# Patient Record
Sex: Male | Born: 1985 | Race: White | Hispanic: No | Marital: Single | Smoking: Current every day smoker
Health system: Southern US, Community
[De-identification: ages and names within clinical notes are randomized; demographics above are authoritative.]

## PROBLEM LIST (undated history)

## (undated) DIAGNOSIS — T7840XA Allergy, unspecified, initial encounter: Secondary | ICD-10-CM

## (undated) HISTORY — PX: FRACTURE SURGERY: SHX138

## (undated) HISTORY — DX: Allergy, unspecified, initial encounter: T78.40XA

---

## 2017-07-21 ENCOUNTER — Ambulatory Visit: Payer: BLUE CROSS/BLUE SHIELD | Admitting: Psychology

## 2017-08-10 ENCOUNTER — Ambulatory Visit (INDEPENDENT_AMBULATORY_CARE_PROVIDER_SITE_OTHER): Payer: BLUE CROSS/BLUE SHIELD | Admitting: Clinical

## 2017-08-10 DIAGNOSIS — F4323 Adjustment disorder with mixed anxiety and depressed mood: Secondary | ICD-10-CM

## 2017-08-19 ENCOUNTER — Ambulatory Visit (INDEPENDENT_AMBULATORY_CARE_PROVIDER_SITE_OTHER): Payer: BLUE CROSS/BLUE SHIELD | Admitting: Clinical

## 2017-08-19 DIAGNOSIS — F4323 Adjustment disorder with mixed anxiety and depressed mood: Secondary | ICD-10-CM

## 2017-08-20 ENCOUNTER — Ambulatory Visit (INDEPENDENT_AMBULATORY_CARE_PROVIDER_SITE_OTHER): Payer: BLUE CROSS/BLUE SHIELD | Admitting: Clinical

## 2017-08-20 DIAGNOSIS — F4323 Adjustment disorder with mixed anxiety and depressed mood: Secondary | ICD-10-CM | POA: Diagnosis not present

## 2017-08-27 ENCOUNTER — Ambulatory Visit: Payer: BLUE CROSS/BLUE SHIELD | Admitting: Clinical

## 2018-05-07 ENCOUNTER — Ambulatory Visit (INDEPENDENT_AMBULATORY_CARE_PROVIDER_SITE_OTHER): Payer: BLUE CROSS/BLUE SHIELD | Admitting: Physician Assistant

## 2018-05-07 ENCOUNTER — Encounter: Payer: Self-pay | Admitting: Physician Assistant

## 2018-05-07 VITALS — BP 110/70 | HR 72 | Temp 97.7°F | Resp 16 | Ht 69.0 in | Wt 162.4 lb

## 2018-05-07 DIAGNOSIS — Z23 Encounter for immunization: Secondary | ICD-10-CM

## 2018-05-07 DIAGNOSIS — Z114 Encounter for screening for human immunodeficiency virus [HIV]: Secondary | ICD-10-CM | POA: Diagnosis not present

## 2018-05-07 DIAGNOSIS — Z131 Encounter for screening for diabetes mellitus: Secondary | ICD-10-CM

## 2018-05-07 DIAGNOSIS — Z0000 Encounter for general adult medical examination without abnormal findings: Secondary | ICD-10-CM | POA: Diagnosis not present

## 2018-05-07 DIAGNOSIS — Z720 Tobacco use: Secondary | ICD-10-CM | POA: Diagnosis not present

## 2018-05-07 DIAGNOSIS — Z13220 Encounter for screening for lipoid disorders: Secondary | ICD-10-CM

## 2018-05-07 DIAGNOSIS — Z130 Encounter for screening for diseases of the blood and blood-forming organs and certain disorders involving the immune mechanism: Secondary | ICD-10-CM

## 2018-05-07 DIAGNOSIS — Z1329 Encounter for screening for other suspected endocrine disorder: Secondary | ICD-10-CM

## 2018-05-07 MED ORDER — BUPROPION HCL ER (SR) 150 MG PO TB12: ORAL_TABLET | ORAL | 0 refills | Status: DC

## 2018-05-07 NOTE — Progress Notes (Signed)
Patient: Kenneth Fisher, Male    DOB: Jun 10, 1986, 32 y.o.   MRN: [id number] Visit Date: 05/13/2018  Today's Provider: Trey Sailors, PA-C   Chief Complaint  Patient presents with  . New Patient (Initial Visit)   Subjective:    Annual physical exam Kenneth Fisher is a 32 y.o. male who presents today for health maintenance and complete physical. He feels well. He reports exercising daily. He reports he is sleeping well.  Lives in [place], fiance. Relationship going well. Currently lead supervisor for warehouse. Two children no health issues. [employer] for four years, deployed twice to [place] total 16 months.  Currently smokes, interested in quitting.  -----------------------------------------------------------------   Review of Systems  Constitutional: Negative.   HENT: Negative.   Eyes: Negative.   Respiratory: Negative.   Cardiovascular: Negative.   Gastrointestinal: Negative.   Endocrine: Negative.   Genitourinary: Negative.   Musculoskeletal: Negative.   Skin: Negative.   Allergic/Immunologic: Negative.   Neurological: Negative.   Hematological: Negative.   Psychiatric/Behavioral: Negative.     Social History      He  reports that he has been smoking cigarettes. He has a 5.00 pack-year smoking history. He has never used smokeless tobacco. He reports that he drinks alcohol.       Social History   Socioeconomic History  . Marital status: Single    Spouse name: Not on file  . Number of children: Not on file  . Years of education: Not on file  . Highest education level: Not on file  Occupational History  . Not on file  Social Needs  . Financial resource strain: Not on file  . Food insecurity:    Worry: Not on file    Inability: Not on file  . Transportation needs:    Medical: Not on file    Non-medical: Not on file  Tobacco Use  . Smoking status: Current Every Day Smoker    Packs/day: 0.50    Years: 10.00    Pack years: 5.00    Types:  Cigarettes  . Smokeless tobacco: Never Used  Substance and Sexual Activity  . Alcohol use: Yes  . Drug use: Not on file  . Sexual activity: Not on file  Lifestyle  . Physical activity:    Days per week: Not on file    Minutes per session: Not on file  . Stress: Not on file  Relationships  . Social connections:    Talks on phone: Not on file    Gets together: Not on file    Attends religious service: Not on file    Active member of club or organization: Not on file    Attends meetings of clubs or organizations: Not on file    Relationship status: Not on file  Other Topics Concern  . Not on file  Social History Narrative  . Not on file    Past Medical History:  Diagnosis Date  . Allergy      There are no active problems to display for this patient.   Past Surgical History:  Procedure Laterality Date  . FRACTURE SURGERY      Family History        Family Status  Relation Name Status  . Mother  Alive  . Father  Alive  . Sister  Alive  . Brother  Alive        His family history includes Healthy in his brother, father, mother, and sister.  Allergies  Allergen Reactions  . Morphine And Related Hives     Current Outpatient Medications:  .  buPROPion (WELLBUTRIN SR) 150 MG 12 hr tablet, Take one pill daily for three days. ON day four, take one pill twice daily., Disp: 180 tablet, Rfl: 0   Patient Care Team: Maryella Shivers as PCP - General (Physician Assistant)      Objective:   Vitals: BP 110/70 (BP Location: Left Arm, Patient Position: Sitting, Cuff Size: Normal)   Pulse 72   Temp 97.7 F (36.5 C) (Oral)   Resp 16   Ht 5\' 9"  (1.753 m)   Wt 162 lb 6.4 oz (73.7 kg)   SpO2 98%   BMI 23.98 kg/m    Vitals:   05/07/18 1122  BP: 110/70  Pulse: 72  Resp: 16  Temp: 97.7 F (36.5 C)  TempSrc: Oral  SpO2: 98%  Weight: 162 lb 6.4 oz (73.7 kg)  Height: 5\' 9"  (1.753 m)     Physical Exam  Constitutional: He is oriented to person, place,  and time. He appears well-developed and well-nourished.  HENT:  Right Ear: External ear normal.  Left Ear: External ear normal.  Mouth/Throat: Oropharynx is clear and moist. No oropharyngeal exudate.  Cardiovascular: Normal rate and regular rhythm.  Pulmonary/Chest: Effort normal and breath sounds normal.  Abdominal: Soft. Bowel sounds are normal.  Neurological: He is alert and oriented to person, place, and time.  Skin: Skin is warm and dry.  Psychiatric: He has a normal mood and affect. His behavior is normal.     Depression Screen PHQ 2/9 Scores 05/07/2018  PHQ - 2 Score 0  PHQ- 9 Score 0      Assessment & Plan:     Routine Health Maintenance and Physical Exam  Exercise Activities and Dietary recommendations Goals   None     Immunization History  Administered Date(s) Administered  . Influenza,inj,Quad PF,6+ Mos 05/07/2018  . Tdap 05/07/2018    Health Maintenance  Topic Date Due  . TETANUS/TDAP  05/07/2028  . INFLUENZA VACCINE  Completed  . HIV Screening  Completed     Discussed health benefits of physical activity, and encouraged him to engage in regular exercise appropriate for his age and condition.    1. Annual physical exam   2. Tobacco abuse  - buPROPion (WELLBUTRIN SR) 150 MG 12 hr tablet; Take one pill daily for three days. ON day four, take one pill twice daily.  Dispense: 180 tablet; Refill: 0  3. Encounter for screening for HIV  - HIV antibody (with reflex)  4. Diabetes mellitus screening  - Comprehensive Metabolic Panel (CMET)  5. Thyroid disorder screening  - TSH  6. Screening for deficiency anemia  - CBC with Differential  7. Screening cholesterol level  - Lipid Profile  8. Need for influenza vaccination  - Flu Vaccine QUAD 36+ mos IM  9. Need for Tdap vaccination  - Tdap vaccine greater than or equal to 7yo IM  Return in about 1 year (around 05/08/2019) for CPE.  The entirety of the information documented in the  History of Present Illness, Review of Systems and Physical Exam were personally obtained by me. Portions of this information were initially documented by Rondel Baton, CMA and reviewed by me for thoroughness and accuracy.   --------------------------------------------------------------------    Trey Sailors, PA-C  [facility]

## 2018-05-08 LAB — CBC WITH DIFFERENTIAL/PLATELET
Basophils Absolute: 0.1 x10E3/uL (ref 0.0–0.2)
Basos: 1 %
EOS (ABSOLUTE): 0.2 x10E3/uL (ref 0.0–0.4)
Eos: 4 %
Hematocrit: 43.6 % (ref 37.5–51.0)
Hemoglobin: 15.3 g/dL (ref 13.0–17.7)
Immature Grans (Abs): 0 x10E3/uL (ref 0.0–0.1)
Immature Granulocytes: 0 %
Lymphocytes Absolute: 1.2 x10E3/uL (ref 0.7–3.1)
Lymphs: 22 %
MCH: 30.4 pg (ref 26.6–33.0)
MCHC: 35.1 g/dL (ref 31.5–35.7)
MCV: 87 fL (ref 79–97)
Monocytes Absolute: 0.6 x10E3/uL (ref 0.1–0.9)
Monocytes: 12 %
Neutrophils Absolute: 3.2 x10E3/uL (ref 1.4–7.0)
Neutrophils: 61 %
Platelets: 239 x10E3/uL (ref 150–450)
RBC: 5.03 x10E6/uL (ref 4.14–5.80)
RDW: 12.6 % (ref 12.3–15.4)
WBC: 5.3 x10E3/uL (ref 3.4–10.8)

## 2018-05-08 LAB — COMPREHENSIVE METABOLIC PANEL
ALT: 20 IU/L (ref 0–44)
AST: 18 IU/L (ref 0–40)
Albumin/Globulin Ratio: 2.2 (ref 1.2–2.2)
Albumin: 4.8 g/dL (ref 3.5–5.5)
Alkaline Phosphatase: 52 IU/L (ref 39–117)
BUN/Creatinine Ratio: 13 (ref 9–20)
BUN: 15 mg/dL (ref 6–20)
Bilirubin Total: 0.8 mg/dL (ref 0.0–1.2)
CO2: 22 mmol/L (ref 20–29)
Calcium: 9.9 mg/dL (ref 8.7–10.2)
Chloride: 104 mmol/L (ref 96–106)
Creatinine, Ser: 1.18 mg/dL (ref 0.76–1.27)
GFR calc Af Amer: 95 mL/min/{1.73_m2} (ref >59)
GFR calc non Af Amer: 82 mL/min/{1.73_m2} (ref >59)
Globulin, Total: 2.2 g/dL (ref 1.5–4.5)
Glucose: 74 mg/dL (ref 65–99)
Potassium: 4.4 mmol/L (ref 3.5–5.2)
Sodium: 145 mmol/L — ABNORMAL HIGH (ref 134–144)
Total Protein: 7 g/dL (ref 6.0–8.5)

## 2018-05-08 LAB — LIPID PANEL
Chol/HDL Ratio: 2.4 ratio (ref 0.0–5.0)
Cholesterol, Total: 145 mg/dL (ref 100–199)
HDL: 61 mg/dL (ref >39)
LDL Calculated: 71 mg/dL (ref 0–99)
Triglycerides: 66 mg/dL (ref 0–149)
VLDL Cholesterol Cal: 13 mg/dL (ref 5–40)

## 2018-05-08 LAB — HIV ANTIBODY (ROUTINE TESTING W REFLEX): HIV Screen 4th Generation wRfx: NONREACTIVE

## 2018-05-08 LAB — TSH: TSH: 4.22 u[IU]/mL (ref 0.450–4.500)

## 2018-05-12 ENCOUNTER — Telehealth: Payer: Self-pay

## 2018-05-12 NOTE — Telephone Encounter (Signed)
-----   Message from Trey Sailors, [place] sent at 05/12/2018  2:14 PM EDT ----- Labwork all normal.

## 2018-05-12 NOTE — Telephone Encounter (Signed)
lmtcb

## 2018-05-13 ENCOUNTER — Encounter: Payer: Self-pay | Admitting: Physician Assistant

## 2018-05-13 NOTE — Telephone Encounter (Signed)
Patient advised as below.  

## 2021-02-26 ENCOUNTER — Ambulatory Visit
Admission: EM | Admit: 2021-02-26 | Discharge: 2021-02-26 | Disposition: A | Discharge status: Home / Self Care | Payer: BC Managed Care – PPO | Attending: Emergency Medicine | Admitting: Emergency Medicine

## 2021-02-26 ENCOUNTER — Other Ambulatory Visit: Payer: Self-pay

## 2021-02-26 ENCOUNTER — Ambulatory Visit (INDEPENDENT_AMBULATORY_CARE_PROVIDER_SITE_OTHER): Payer: BC Managed Care – PPO

## 2021-02-26 DIAGNOSIS — M79645 Pain in left finger(s): Secondary | ICD-10-CM | POA: Diagnosis not present

## 2021-02-26 DIAGNOSIS — S62633B Displaced fracture of distal phalanx of left middle finger, initial encounter for open fracture: Secondary | ICD-10-CM

## 2021-02-26 MED ORDER — DOXYCYCLINE HYCLATE 100 MG PO CAPS: 100.0000 mg | ORAL_CAPSULE | Freq: Two times a day (BID) | ORAL | 0 refills | Status: AC

## 2021-02-26 NOTE — ED Triage Notes (Signed)
Pt c/o puncture wound to the left middle finger on Sunday with a nail. Pt reports area is painful to touch and has some bruising. Pt has been keeping area cleaned and covered. Pt last Tetanus shot was 2019.

## 2021-02-26 NOTE — ED Provider Notes (Addendum)
[facility]    CSN: [id number] Arrival date & time: 02/26/21  4098      History   Chief Complaint No chief complaint on file.   HPI Kenneth Fisher is a 35 y.o. male.   HPI  35 year old male here for evaluation of puncture wound to left middle finger.  Patient reports that he was framing his deck with a nail gun and one of the nails went through a 2 x 4 and into his left middle finger.  This occurred 2 days ago.  He states that it bled significantly but it has stopped bleeding.  He denies any numbness or tingling from his finger and there is no drainage from the wound.  He does have full range of motion and full sensation of his finger but the distal phalanx is mildly swollen, red, and warm.  Past Medical History:  Diagnosis Date   Allergy     There are no problems to display for this patient.   Past Surgical History:  Procedure Laterality Date   FRACTURE SURGERY         Home Medications    Prior to Admission medications   Medication Sig Start Date End Date Taking? Authorizing Provider  doxycycline (VIBRAMYCIN) 100 MG capsule Take 1 capsule (100 mg total) by mouth 2 (two) times daily. 02/26/21  Yes Becky Augusta, NP    Family History Family History  Problem Relation Age of Onset   Healthy Mother    Healthy Father    Healthy Sister    Healthy Brother     Social History Social History   Tobacco Use   Smoking status: Every Day    Packs/day: 0.50    Years: 10.00    Pack years: 5.00    Types: Cigarettes   Smokeless tobacco: Never  Vaping Use   Vaping Use: Never used  Substance Use Topics   Alcohol use: Yes     Allergies   Morphine and related   Review of Systems Review of Systems  Constitutional:  Negative for activity change, appetite change and fever.  Skin:  Positive for color change and wound.  Neurological:  Negative for weakness and numbness.  Hematological: Negative.   Psychiatric/Behavioral: Negative.      Physical  Exam Triage Vital Signs ED Triage Vitals  Enc Vitals Group     BP 02/26/21 1011 117/76     Pulse Rate 02/26/21 1011 73     Resp 02/26/21 1011 18     Temp 02/26/21 1011 98.2 F (36.8 C)     Temp Source 02/26/21 1011 Oral     SpO2 02/26/21 1011 98 %     Weight 02/26/21 1009 165 lb (74.8 kg)     Height 02/26/21 1009 5\' 9"  (1.753 m)     Head Circumference --      Peak Flow --      Pain Score 02/26/21 1008 4     Pain Loc --      Pain Edu? --      Excl. in GC? --    No data found.  Updated Vital Signs BP 117/76 (BP Location: Left Arm)   Pulse 73   Temp 98.2 F (36.8 C) (Oral)   Resp 18   Ht 5\' 9"  (1.753 m)   Wt 165 lb (74.8 kg)   SpO2 98%   BMI 24.37 kg/m   Visual Acuity Right Eye Distance:   Left Eye Distance:   Bilateral Distance:    Right Eye  Near:   Left Eye Near:    Bilateral Near:     Physical Exam Vitals and nursing note reviewed.  Constitutional:      General: He is not in acute distress.    Appearance: Normal appearance. He is normal weight. He is not ill-appearing.  HENT:     Head: Normocephalic and atraumatic.  Musculoskeletal:        General: Swelling, tenderness and signs of injury present. No deformity. Normal range of motion.  Skin:    General: Skin is warm and dry.     Capillary Refill: Capillary refill takes less than 2 seconds.     Findings: Erythema present. No bruising.  Neurological:     General: No focal deficit present.     Mental Status: He is alert and oriented to person, place, and time.  Psychiatric:        Mood and Affect: Mood normal.        Behavior: Behavior normal.        Thought Content: Thought content normal.        Judgment: Judgment normal.     UC Treatments / Results  Labs (all labs ordered are listed, but only abnormal results are displayed) Labs Reviewed - No data to display  EKG   Radiology DG Finger Middle Left  Result Date: 02/26/2021 CLINICAL DATA:  35 year old male with redness and swelling after  penetrating injury EXAM: LEFT MIDDLE FINGER 2+V COMPARISON:  None. FINDINGS: Lateral view demonstrates cortical bone up lifting on the volar aspect of the distal phalanx of the third digit left hand. Soft tissue swelling. No subluxation/dislocation. No retained metallic foreign body. IMPRESSION: Evidence of bony injury/fracture with cortical bone up-lifting on the volar surface of the distal phalanx third digit left hand. Associated soft tissue swelling. Electronically Signed   By: Gilmer Mor D.O.   On: 02/26/2021 11:04    Procedures Procedures (including critical care time)  Medications Ordered in UC Medications - No data to display  Initial Impression / Assessment and Plan / UC Course  I have reviewed the triage vital signs and the nursing notes.  Pertinent labs & imaging results that were available during my care of the patient were reviewed by me and considered in my medical decision making (see chart for details).  Patient is a pleasant, nontoxic-appearing 35 year old male here for evaluation of a puncture wound to the distal phalanx of his left middle finger.  The puncture wound was sustained from a pneumatic nail gun 2 days ago.  He states that the nail went in approximately an inch into his finger but did not come out outside.  There was significant bleeding initially which she finally got to stop.  He denies any drainage from the wound.  There is erythema of the distal phalanx on the volar aspect of the left middle finger with a puncture wound to the lateral aspects of the proximal section of the distal phalanx.  There are some mild ecchymosis around the puncture wound and the pad of the finger is erythematous without fluctuance or induration.  There is definite concern for developing cellulitis.  We will obtain radiograph to evaluate for possible open fracture of distal phalanx.  X-rays of left middle finger independently reviewed and evaluated by me.  There is no evidence of fracture or  dislocation on the AP and oblique images.  There is an irregular density on the volar surface of the distal phalanx in the lateral view.  Awaiting radiology overread. Radiology  over read concurs with my findings and states that this represents an uplifting of the bone.  We will treat patient for an open fracture of his left distal middle finger with doxycycline and refer him to orthopedics for evaluation.  We will provide information for [facility] and also Dr. Rosita Kea is on for unassigned call.     Final Clinical Impressions(s) / UC Diagnoses   Final diagnoses:  Open displaced fracture of distal phalanx of left middle finger, initial encounter     Discharge Instructions      Your x-rays today revealed the presence of an avulsion fracture of your distal phalanx.  Take the doxycycline twice daily with food for 10 days for treatment of the soft tissue skin infection that is forming as evidenced by the redness and swelling of your fingertip.  Wear the splint on your finger until evaluated by orthopedics.  I have given you to orthopedist to follow-up with who have hand specialty the first is Dr. Rosita Kea with [facility], and the second is Dr. Mathis Bud with [facility].  I would call to schedule an appointment at your earliest convenience so they can evaluate your injury and monitor to you to make sure that you are healing appropriately.     ED Prescriptions     Medication Sig Dispense Auth. Provider   doxycycline (VIBRAMYCIN) 100 MG capsule Take 1 capsule (100 mg total) by mouth 2 (two) times daily. 20 capsule Becky Augusta, NP      PDMP not reviewed this encounter.   Becky Augusta, NP 02/26/21 1127    Becky Augusta, NP 02/26/21 [id number]

## 2021-02-26 NOTE — Discharge Instructions (Addendum)
Your x-rays today revealed the presence of an avulsion fracture of your distal phalanx.  Take the doxycycline twice daily with food for 10 days for treatment of the soft tissue skin infection that is forming as evidenced by the redness and swelling of your fingertip.  Wear the splint on your finger until evaluated by orthopedics.  I have given you to orthopedist to follow-up with who have hand specialty the first is Dr. Rosita Kea with [facility], and the second is Dr. Mathis Bud with [facility].  I would call to schedule an appointment at your earliest convenience so they can evaluate your injury and monitor to you to make sure that you are healing appropriately.

## 2023-02-07 IMAGING — CR DG FINGER MIDDLE 2+V*L*
3 series · 3 of 3 positions shown · non-contrast
Comparison: None.

CLINICAL DATA: 34-year-old male with redness and swelling after
penetrating injury

EXAM:
LEFT MIDDLE FINGER 2+V

[finger ap]
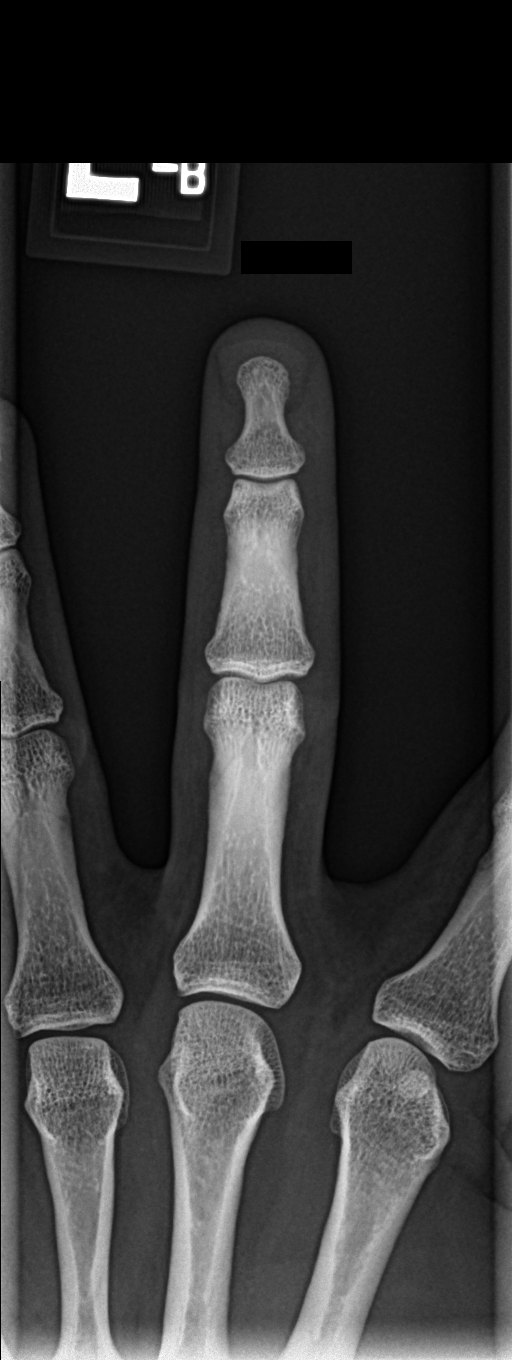

[finger obl]
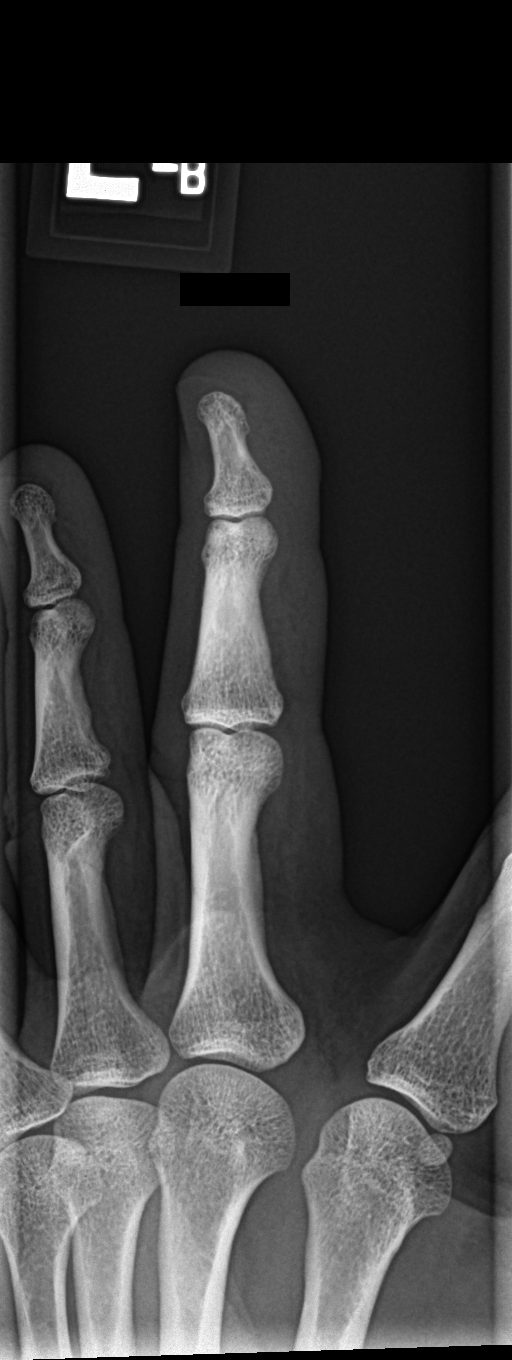

[finger lat]
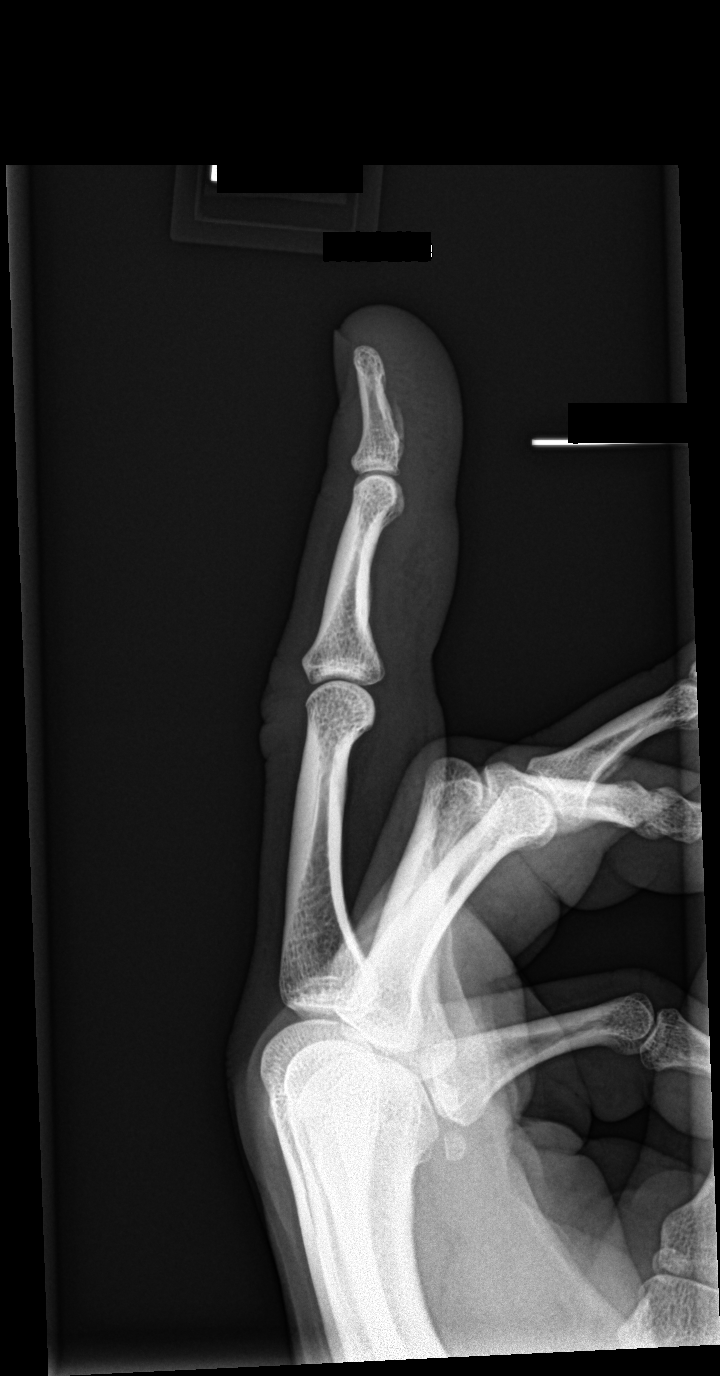

[3 of 3 positions shown; findings below may reference images not displayed]

FINDINGS: Lateral view demonstrates cortical bone up lifting on the volar
aspect of the distal phalanx of the third digit left hand. Soft
tissue swelling. No subluxation/dislocation. No retained metallic
foreign body.
IMPRESSION: Evidence of bony injury/fracture with cortical bone up-lifting on
the volar surface of the distal phalanx third digit left hand.
Associated soft tissue swelling.
